# Patient Record
Sex: Female | Born: 1975 | Race: Asian | Hispanic: No | Marital: Married | Smoking: Never smoker
Health system: Southern US, Community
[De-identification: ages and names within clinical notes are randomized; demographics above are authoritative.]

---

## 2015-07-18 ENCOUNTER — Emergency Department (HOSPITAL_BASED_OUTPATIENT_CLINIC_OR_DEPARTMENT_OTHER): Payer: BLUE CROSS/BLUE SHIELD

## 2015-07-18 ENCOUNTER — Encounter (HOSPITAL_BASED_OUTPATIENT_CLINIC_OR_DEPARTMENT_OTHER): Payer: Self-pay | Admitting: Emergency Medicine

## 2015-07-18 ENCOUNTER — Emergency Department (HOSPITAL_BASED_OUTPATIENT_CLINIC_OR_DEPARTMENT_OTHER)
Admission: EM | Admit: 2015-07-18 | Discharge: 2015-07-19 | Disposition: A | Discharge status: Home / Self Care | Payer: BLUE CROSS/BLUE SHIELD | Attending: Emergency Medicine | Admitting: Emergency Medicine

## 2015-07-18 DIAGNOSIS — R51 Headache: Secondary | ICD-10-CM | POA: Diagnosis present

## 2015-07-18 DIAGNOSIS — R519 Headache, unspecified: Secondary | ICD-10-CM

## 2015-07-18 LAB — PREGNANCY, URINE: Preg Test, Ur: NEGATIVE

## 2015-07-18 MED ORDER — ACETAMINOPHEN 500 MG PO TABS
1000.0000 mg | ORAL_TABLET | Freq: Once | ORAL | Status: AC
Start: 2015-07-18 — End: 2015-07-18
  Administered 2015-07-18: 1000 mg via ORAL
  Filled 2015-07-18: qty 2

## 2015-07-18 NOTE — ED Notes (Addendum)
Patient states that she is having a Headache with dizziness. The patient also reports that she is nauseated. Neuro is intact at this time. Patient is no distriss

## 2015-07-18 NOTE — ED Provider Notes (Signed)
CSN: [id number]     Arrival date & time 07/18/15  1907 History   By signing my name below, I, Marisue HumbleMichelle Chaffee, attest that this documentation has been prepared under the direction and in the presence of Taiten Brawn, MD . Electronically Signed: Marisue HumbleMichelle Chaffee, Scribe. 07/18/2015. 11:16 PM.   Chief Complaint  Patient presents with  . Headache    Patient is a 40 y.o. female presenting with headaches. The history is provided by the patient. No language interpreter was used.  Headache Pain location:  Frontal Radiates to:  Does not radiate Onset quality:  Gradual Duration:  6 hours Timing:  Constant Progression:  Worsening Chronicity:  New Context: not activity   Relieved by:  Nothing Associated symptoms: no ear pain, no fever, no numbness, no sore throat and no weakness    HPI Comments:  Nichole Mills is a 40 y.o. female who presents to the Emergency Department complaining of constant, moderate headache across her forehead onset ~6 hours ago. Pt reports associated rhinorrhea. No alleviating factors noted or treatments attempted PTA. Denies fever, sore throat, or ear pain. No other symptoms or complaints at this time.  History reviewed. No pertinent past medical history. History reviewed. No pertinent past surgical history. History reviewed. No pertinent family history. Social History  Substance Use Topics  . Smoking status: Never Smoker   . Smokeless tobacco: None  . Alcohol Use: No   OB History    No data available     Review of Systems  Constitutional: Negative for fever.  HENT: Positive for rhinorrhea. Negative for ear pain and sore throat.   Neurological: Positive for headaches. Negative for speech difficulty, weakness, light-headedness and numbness.  All other systems reviewed and are negative.   Allergies  Review of patient's allergies indicates no known allergies.  Home Medications   Prior to Admission medications   Not on File   BP 125/87 mmHg  Pulse 77   Temp(Src) 98.2 F (36.8 C) (Oral)  Resp 18  Ht 4\' 9"  (1.448 m)  Wt 134 lb 8 oz (61.009 kg)  BMI 29.10 kg/m2  SpO2 100%   Physical Exam  Constitutional: She is oriented to person, place, and time. She appears well-developed and well-nourished.  HENT:  Head: Normocephalic and atraumatic.  Mouth/Throat: Oropharynx is clear and moist. No oropharyngeal exudate.  Eyes: EOM are normal. Pupils are equal, round, and reactive to light.  Neck: Normal range of motion. Neck supple.  No meningismus   Cardiovascular: Normal rate and regular rhythm.   Pulmonary/Chest: Effort normal and breath sounds normal. No respiratory distress. She has no wheezes. She has no rales.  Abdominal: Soft. Bowel sounds are normal. She exhibits no mass. There is no rebound and no guarding.  Musculoskeletal: Normal range of motion.  Lymphadenopathy:    She has no cervical adenopathy.  Neurological: She is alert and oriented to person, place, and time. She has normal reflexes. No cranial nerve deficit.  Cranial nerves 2-12 intact; 5/5 U&L extremity strength  Skin: Skin is warm and dry.  Psychiatric: She has a normal mood and affect.  Nursing note and vitals reviewed.   ED Course  Procedures  DIAGNOSTIC STUDIES:  Oxygen Saturation is 99% on RA, normal by my interpretation.    COORDINATION OF CARE:  11:16 PM Will give Tylenol and order head CT. Discussed treatment plan with pt at bedside and pt agreed to plan.  Labs Review Labs Reviewed  PREGNANCY, URINE    Imaging Review Ct Head Wo  Contrast  07/18/2015  CLINICAL DATA:  Headache with sinus drainage for 6 hours. EXAM: CT HEAD WITHOUT CONTRAST TECHNIQUE: Contiguous axial images were obtained from the base of the skull through the vertex without intravenous contrast. COMPARISON:  None. FINDINGS: The paranasal sinuses, mastoid air cells, bones, and extracranial soft tissues are normal. No subdural, epidural, or subarachnoid hemorrhage. Ventricles and sulci are  normal for age. Cerebellum, brainstem, and basal cisterns are normal. No acute cortical ischemia or infarct. No mass, mass effect, or midline shift. IMPRESSION: Normal. Electronically Signed   By: Gerome Samavid  Williams III M.D   On: 07/18/2015 23:59   I have personally reviewed and evaluated these images and lab results as part of my medical decision-making.   EKG Interpretation None      MDM   Final diagnoses:  None   Filed Vitals:   07/18/15 2224 07/18/15 2300  BP: 120/88 125/87  Pulse: 85 77  Temp:    Resp: 18     Results for orders placed or performed during the hospital encounter of 07/18/15  Pregnancy, urine  Result Value Ref Range   Preg Test, Ur NEGATIVE NEGATIVE   Ct Head Wo Contrast  07/18/2015  CLINICAL DATA:  Headache with sinus drainage for 6 hours. EXAM: CT HEAD WITHOUT CONTRAST TECHNIQUE: Contiguous axial images were obtained from the base of the skull through the vertex without intravenous contrast. COMPARISON:  None. FINDINGS: The paranasal sinuses, mastoid air cells, bones, and extracranial soft tissues are normal. No subdural, epidural, or subarachnoid hemorrhage. Ventricles and sulci are normal for age. Cerebellum, brainstem, and basal cisterns are normal. No acute cortical ischemia or infarct. No mass, mass effect, or midline shift. IMPRESSION: Normal. Electronically Signed   By: Gerome Samavid  Williams III M.D   On: 07/18/2015 23:59     Medications  acetaminophen (TYLENOL) tablet 1,000 mg (1,000 mg Oral Given 07/18/15 2326)  ibuprofen (ADVIL,MOTRIN) tablet 800 mg (800 mg Oral Given 07/19/15 0030)    Pain free post medication.  Resting comfortably. Well appearing.  Stable for discharge at this time with close follow up    I personally performed the services described in this documentation, which was scribed in my presence. The recorded information has been reviewed and is accurate.      Cy BlamerApril Maddison Kilner, MD 07/19/15 0730

## 2015-07-19 ENCOUNTER — Encounter (HOSPITAL_BASED_OUTPATIENT_CLINIC_OR_DEPARTMENT_OTHER): Payer: Self-pay | Admitting: Emergency Medicine

## 2015-07-19 MED ORDER — IBUPROFEN 800 MG PO TABS: 800.0000 mg | ORAL_TABLET | Freq: Three times a day (TID) | ORAL | Status: AC

## 2015-07-19 MED ORDER — IBUPROFEN 800 MG PO TABS
800.0000 mg | ORAL_TABLET | Freq: Once | ORAL | Status: AC
  Administered 2015-07-19: 800 mg via ORAL
  Filled 2015-07-19: qty 1

## 2015-07-19 NOTE — Discharge Instructions (Signed)

## 2016-11-04 IMAGING — CT CT HEAD W/O CM
3 series · 16 of 46 positions shown, 19 images · non-contrast
Comparison: None.

CLINICAL DATA: Headache with sinus drainage for 6 hours.

EXAM:
CT HEAD WITHOUT CONTRAST
TECHNIQUE: Contiguous axial images were obtained from the base of the skull
through the vertex without intravenous contrast.

[Series 2: head wo · axial · 0.41mm/px · z∈[+1022,+1142]mm · 10 of 29 slices shown, 13 images]
[im 3/29  brain]
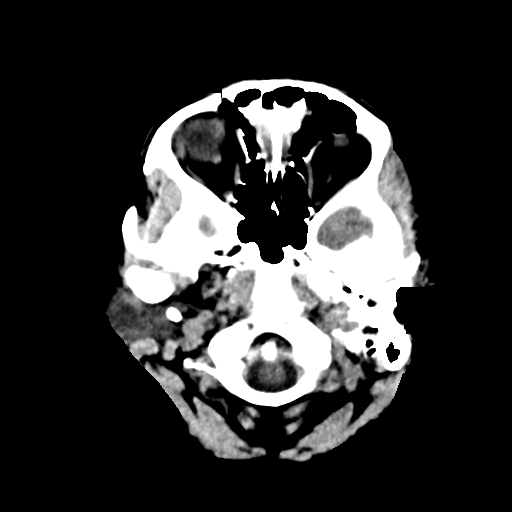
[im 3/29  bone]
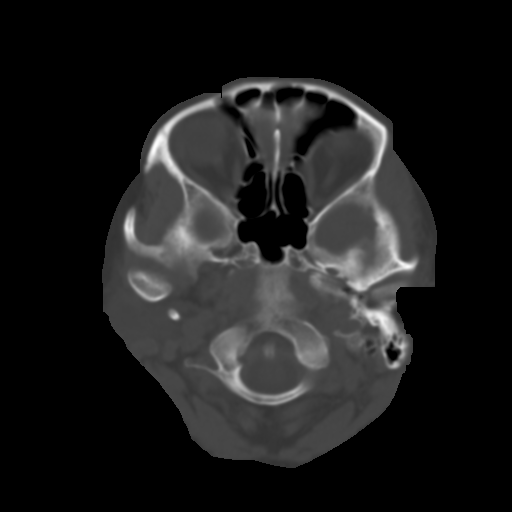
[im 6/29  brain]
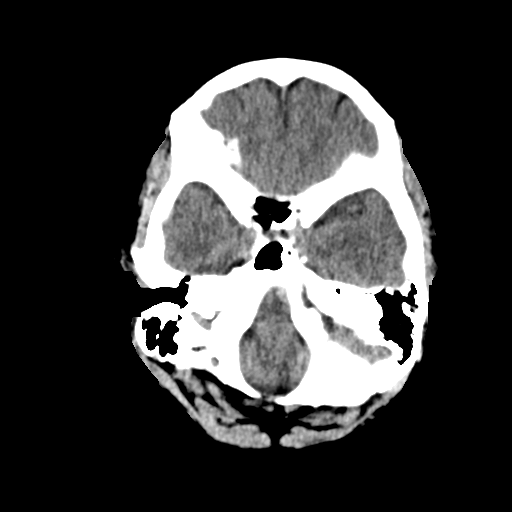
[im 8/29  brain]
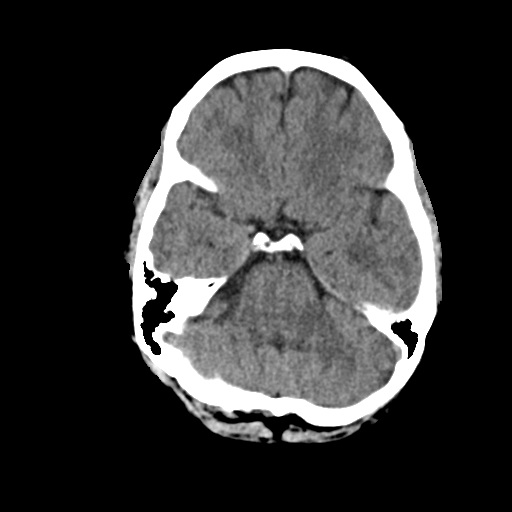
[im 11/29  brain]
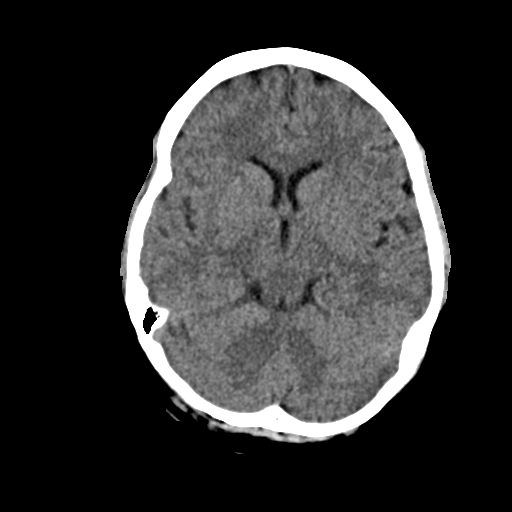
[im 14/29  brain]
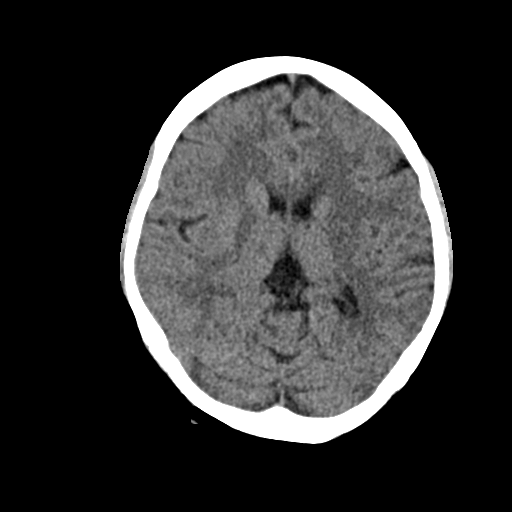
[im 14/29  bone]
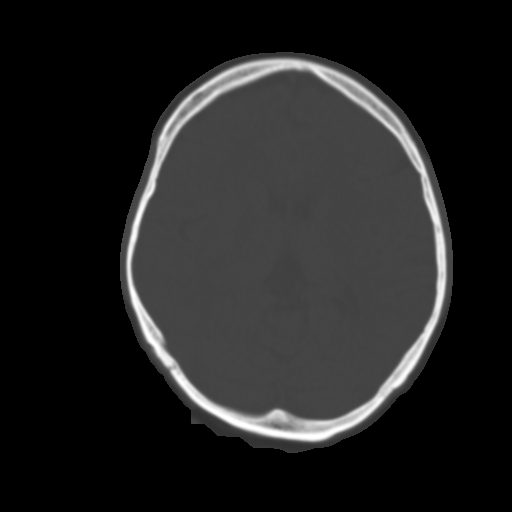
[im 16/29  brain]
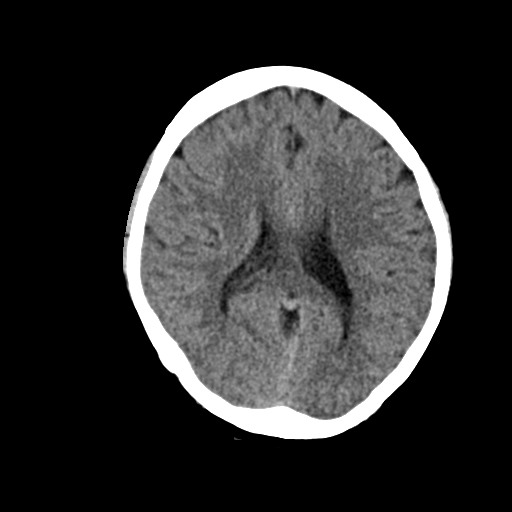
[im 19/29  brain]
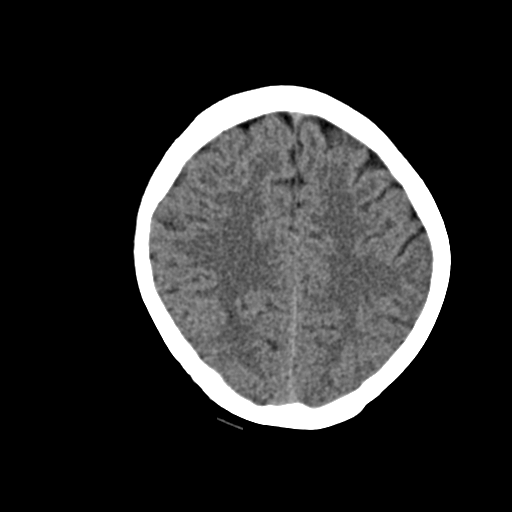
[im 22/29  brain]
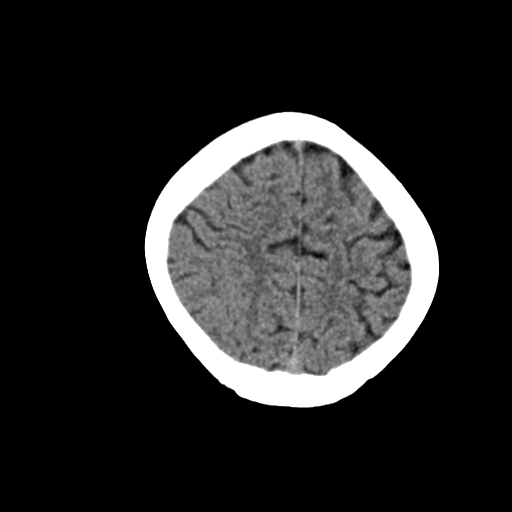
[im 24/29  brain]
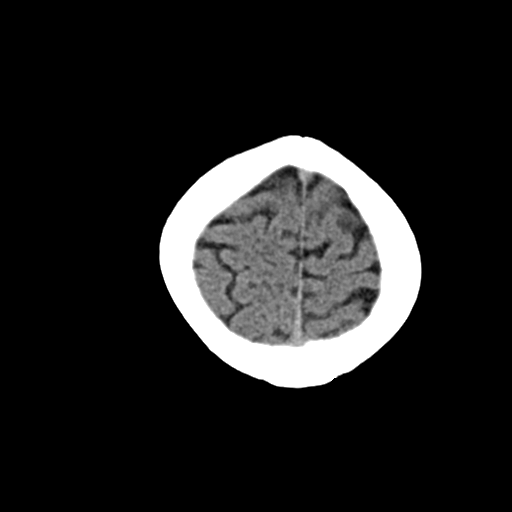
[im 24/29  bone]
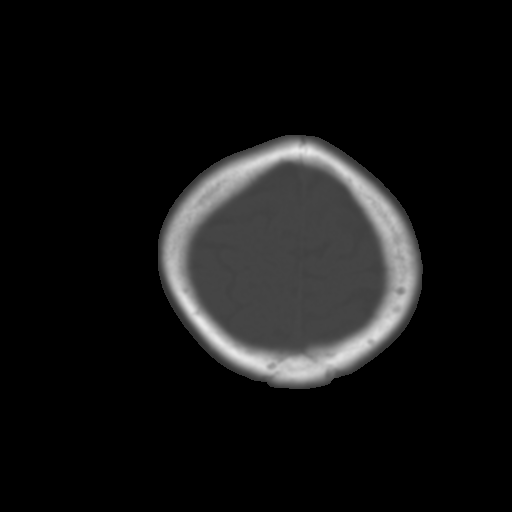
[im 27/29  brain]
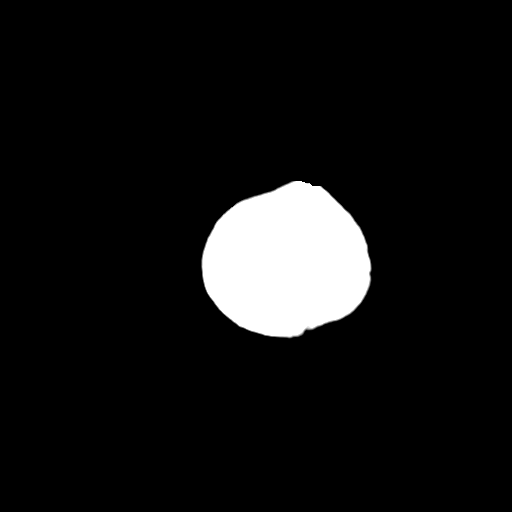

[Series 4: coronal soft · coronal · 0.31mm/px · 3 of 67 slices shown]
[im 30/67  brain]
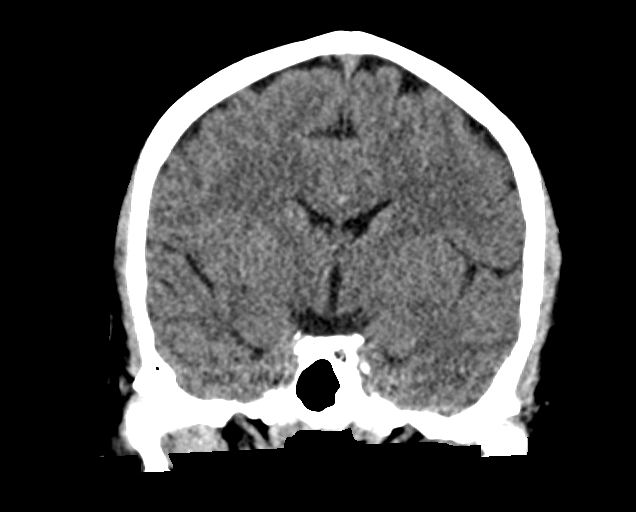
[im 37/67  brain]
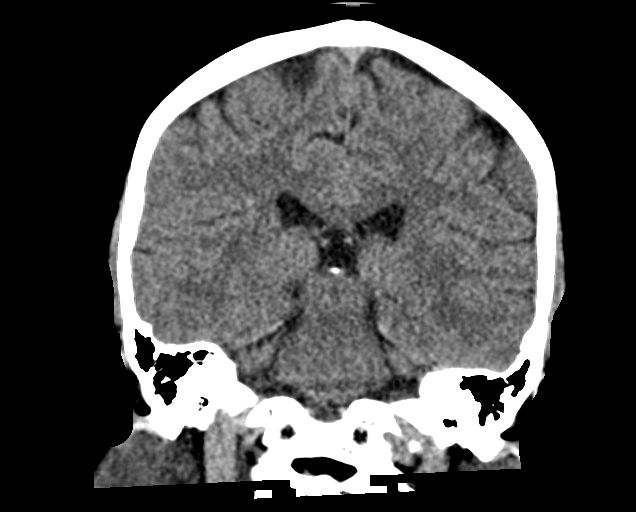
[im 44/67  brain]
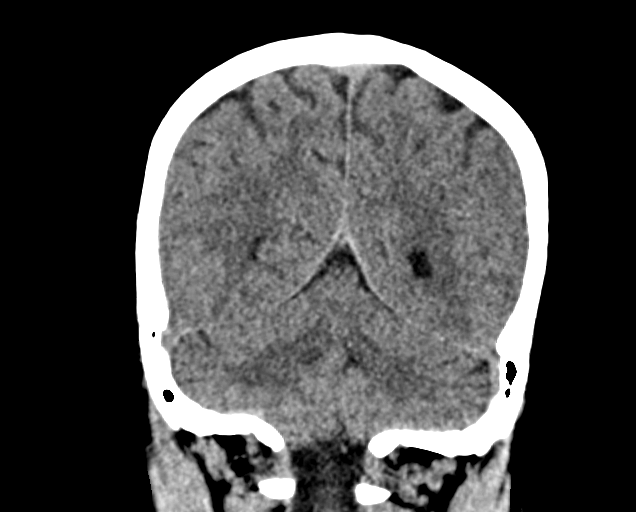

[Series 5: sag soft · sagittal · 0.31mm/px · 3 of 57 slices shown]
[im 19/57  brain]
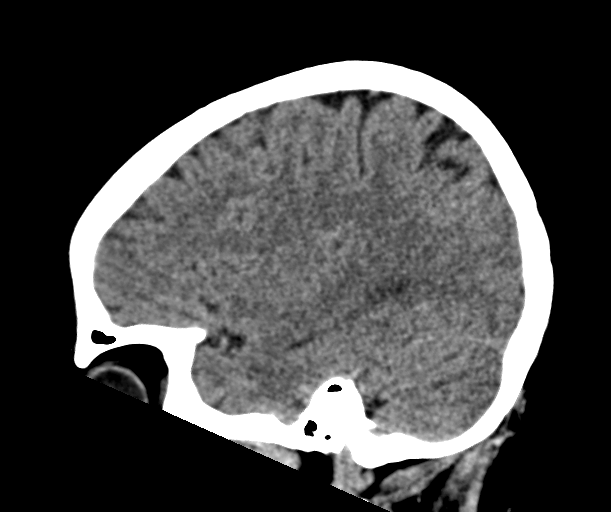
[im 29/57  brain]
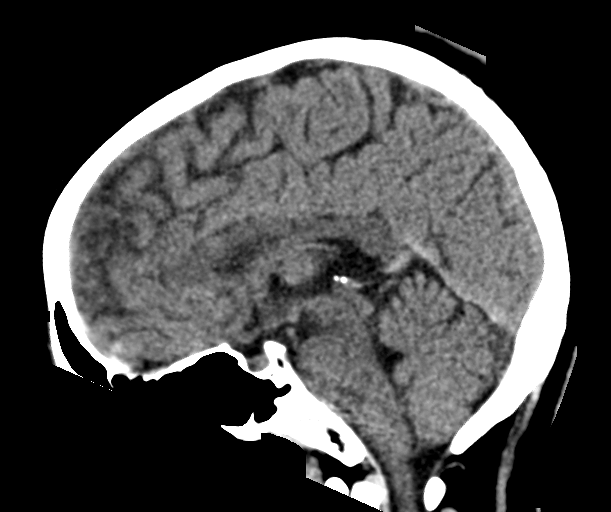
[im 38/57  brain]
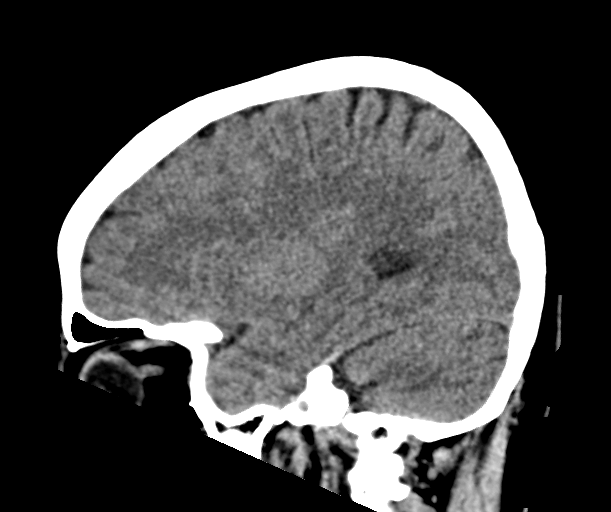

[16 of 46 positions shown; findings below may reference images not displayed]

FINDINGS: The paranasal sinuses, mastoid air cells, bones, and extracranial
soft tissues are normal. No subdural, epidural, or subarachnoid
hemorrhage. Ventricles and sulci are normal for age. Cerebellum,
brainstem, and basal cisterns are normal. No acute cortical ischemia
or infarct. No mass, mass effect, or midline shift.
IMPRESSION: Normal.
